# Patient Record
Sex: Female | Born: 1955 | Race: White | Hispanic: No | Marital: Single | Smoking: Never smoker
Health system: Southern US, Community
[De-identification: ages and names within clinical notes are randomized; demographics above are authoritative.]

---

## 1998-11-02 ENCOUNTER — Other Ambulatory Visit: Admission: RE | Admit: 1998-11-02 | Discharge: 1998-11-02 | Payer: Self-pay | Admitting: Obstetrics and Gynecology

## 1999-12-14 ENCOUNTER — Other Ambulatory Visit: Admission: RE | Admit: 1999-12-14 | Discharge: 1999-12-14 | Payer: Self-pay | Admitting: Obstetrics and Gynecology

## 2001-01-27 ENCOUNTER — Other Ambulatory Visit: Admission: RE | Admit: 2001-01-27 | Discharge: 2001-01-27 | Payer: Self-pay | Admitting: Obstetrics and Gynecology

## 2001-02-25 ENCOUNTER — Encounter (INDEPENDENT_AMBULATORY_CARE_PROVIDER_SITE_OTHER): Payer: Self-pay | Admitting: Specialist

## 2001-02-25 ENCOUNTER — Other Ambulatory Visit: Admission: RE | Admit: 2001-02-25 | Discharge: 2001-02-25 | Payer: Self-pay | Admitting: Obstetrics and Gynecology

## 2003-04-01 ENCOUNTER — Other Ambulatory Visit: Admission: RE | Admit: 2003-04-01 | Discharge: 2003-04-01 | Payer: Self-pay | Admitting: Family Medicine

## 2003-05-06 ENCOUNTER — Other Ambulatory Visit: Admission: RE | Admit: 2003-05-06 | Discharge: 2003-05-06 | Payer: Self-pay | Admitting: Obstetrics and Gynecology

## 2004-05-02 ENCOUNTER — Other Ambulatory Visit: Admission: RE | Admit: 2004-05-02 | Discharge: 2004-05-02 | Payer: Self-pay | Admitting: Obstetrics and Gynecology

## 2006-10-04 ENCOUNTER — Ambulatory Visit (HOSPITAL_COMMUNITY)
Admission: RE | Admit: 2006-10-04 | Discharge: 2006-10-04 | Payer: Self-pay | Admitting: Orthodontics and Dentofacial Orthopedics

## 2007-08-14 ENCOUNTER — Other Ambulatory Visit: Admission: RE | Admit: 2007-08-14 | Discharge: 2007-08-14 | Payer: Self-pay | Admitting: Obstetrics and Gynecology

## 2008-08-19 ENCOUNTER — Other Ambulatory Visit: Admission: RE | Admit: 2008-08-19 | Discharge: 2008-08-19 | Payer: Self-pay | Admitting: Obstetrics and Gynecology

## 2009-03-14 ENCOUNTER — Encounter: Admission: RE | Admit: 2009-03-14 | Discharge: 2009-03-14 | Payer: Self-pay | Admitting: Obstetrics & Gynecology

## 2009-05-05 ENCOUNTER — Encounter: Admission: RE | Admit: 2009-05-05 | Discharge: 2009-05-05 | Payer: Self-pay | Admitting: Obstetrics & Gynecology

## 2014-01-11 ENCOUNTER — Other Ambulatory Visit: Payer: Self-pay | Admitting: Internal Medicine

## 2014-01-11 DIAGNOSIS — Z1231 Encounter for screening mammogram for malignant neoplasm of breast: Secondary | ICD-10-CM

## 2014-02-22 ENCOUNTER — Ambulatory Visit
Admission: RE | Admit: 2014-02-22 | Discharge: 2014-02-22 | Disposition: A | Discharge status: Home / Self Care | Payer: BC Managed Care – PPO | Source: Ambulatory Visit | Attending: Internal Medicine | Admitting: Internal Medicine

## 2014-02-22 DIAGNOSIS — Z1231 Encounter for screening mammogram for malignant neoplasm of breast: Secondary | ICD-10-CM

## 2015-06-01 ENCOUNTER — Emergency Department (HOSPITAL_COMMUNITY)
Admission: EM | Admit: 2015-06-01 | Discharge: 2015-06-02 | Disposition: A | Discharge status: Home / Self Care | Payer: BC Managed Care – PPO | Attending: Emergency Medicine | Admitting: Emergency Medicine

## 2015-06-01 ENCOUNTER — Encounter (HOSPITAL_COMMUNITY): Payer: Self-pay | Admitting: Emergency Medicine

## 2015-06-01 DIAGNOSIS — Y9389 Activity, other specified: Secondary | ICD-10-CM | POA: Diagnosis not present

## 2015-06-01 DIAGNOSIS — Y998 Other external cause status: Secondary | ICD-10-CM | POA: Diagnosis not present

## 2015-06-01 DIAGNOSIS — IMO0002 Reserved for concepts with insufficient information to code with codable children: Secondary | ICD-10-CM

## 2015-06-01 DIAGNOSIS — Y9289 Other specified places as the place of occurrence of the external cause: Secondary | ICD-10-CM | POA: Insufficient documentation

## 2015-06-01 DIAGNOSIS — R52 Pain, unspecified: Secondary | ICD-10-CM

## 2015-06-01 DIAGNOSIS — W260XXA Contact with knife, initial encounter: Secondary | ICD-10-CM | POA: Insufficient documentation

## 2015-06-01 DIAGNOSIS — Z23 Encounter for immunization: Secondary | ICD-10-CM | POA: Insufficient documentation

## 2015-06-01 DIAGNOSIS — S61210A Laceration without foreign body of right index finger without damage to nail, initial encounter: Secondary | ICD-10-CM | POA: Diagnosis not present

## 2015-06-01 NOTE — ED Provider Notes (Signed)
CSN: [id number]     Arrival date & time 06/01/15  2227 History   This chart was scribed for non-physician practitioner working with April Palumbo, MD by Arlan OrganAshley Leger, ED Scribe. This patient was seen in room TR10C/TR10C and the patient's care was started at 12:00 AM.   Chief Complaint  Patient presents with  . Extremity Laceration   The history is provided by the patient and medical records. No language interpreter was used.    HPI Comments: Mikayla Collins is a 59 y.o. female without any pertinent past medical history who presents to the Emergency Department here for a laceration to the R 1st digit sustained at 9:30 PM this evening. Pt states she was cutting some fruit when she sliced her finger with a sharp knife. Bleeding controlled at this time. Area closed with steri strips and super glue. No recent fever or chills. No weakness, loss of sensation, or paresthesia. Pt unaware of Tetanus status. Pt with known allergy to Sulfa antibiotics.  History reviewed. No pertinent past medical history. History reviewed. No pertinent past surgical history. History reviewed. No pertinent family history. History  Substance Use Topics  . Smoking status: Never Smoker   . Smokeless tobacco: Not on file  . Alcohol Use: Yes   OB History    No data available     Review of Systems  Constitutional: Negative for fever and chills.  Gastrointestinal: Negative for nausea and vomiting.  Musculoskeletal: Positive for arthralgias.  Skin: Positive for wound.  Allergic/Immunologic: Negative for immunocompromised state.  Neurological: Negative for weakness and numbness.  Hematological: Does not bruise/bleed easily.  Psychiatric/Behavioral: The patient is not nervous/anxious.       Allergies  Sulfa antibiotics  Home Medications   Prior to Admission medications   Not on File   Triage Vitals: BP 137/94 mmHg  Pulse 71  Temp(Src) 97.9 F (36.6 C) (Oral)  Resp 18  SpO2 97%   Physical Exam   Constitutional: She is oriented to person, place, and time. She appears well-developed and well-nourished. No distress.  HENT:  Head: Normocephalic and atraumatic.  Eyes: Conjunctivae are normal. No scleral icterus.  Neck: Normal range of motion.  Cardiovascular: Normal rate, regular rhythm, normal heart sounds and intact distal pulses.   No murmur heard. Capillary refill < 3 sec  Pulmonary/Chest: Effort normal and breath sounds normal. No respiratory distress.  Musculoskeletal: Normal range of motion. She exhibits no edema.  ROM: Full range of motion of all joints of the right pointer finger  Neurological: She is alert and oriented to person, place, and time.  Sensation: Intact to dull and sharp Strength: 5/5 with flexion and extension of the PIP, DIP and MCP of the right pointer finger, strong and equal grip strength  Skin: Skin is warm and dry. She is not diaphoretic.  2.7 cm laceration to the right pointer finger  Psychiatric: She has a normal mood and affect.  Nursing note and vitals reviewed.   ED Course  Procedures (including critical care time)  DIAGNOSTIC STUDIES: Oxygen Saturation is 97% on RA, adequate by my interpretation.    COORDINATION OF CARE: 12:00 AM- Will perform laceration repair. Discussed treatment plan with pt at bedside and pt agreed to plan.     LACERATION REPAIR Performed by: Dierdre ForthHannah Leona Pressly PA-C  Consent: Verbal consent obtained. Risks and benefits: risks, benefits and alternatives were discussed Patient identity confirmed: provided demographic data Time out performed prior to procedure Prepped and Draped in normal sterile fashion Wound explored Laceration  Location: Lateral side of the distal Right pointer finger Laceration Length: 2.7 cm No Foreign Bodies seen or palpated Anesthesia: local infiltration Local anesthetic: lidocaine 1 % without epinephrine Anesthetic total: 5 ml Irrigation method: syringe Amount of cleaning: standard Skin  closure: 5-0 proline  Number of sutures or staples: 3  Technique: Simple interrupted  Patient tolerance: Patient tolerated the procedure well with no immediate complications.   Labs Review Labs Reviewed - No data to display  Imaging Review No results found.   EKG Interpretation None      MDM   Final diagnoses:  Laceration of right index finger w/o foreign body w/o damage to nail, initial encounter   Mikayla Collins presents with laceration to the right index finger.  Pressure irrigation performed. Wound explored and base of wound visualized in a bloodless field without evidence of foreign body.  Laceration occurred < 8 hours prior to repair which was well tolerated. Tdap updated.  Pt has no comorbidities to effect normal wound healing. Pt discharged without antibiotics.  Discussed suture home care with patient and answered questions. Pt to follow-up for wound check and suture removal in 7 days; they are to return to the ED sooner for signs of infection. Pt is hemodynamically stable with no complaints prior to dc.   BP 137/94 mmHg  Pulse 71  Temp(Src) 97.9 F (36.6 C) (Oral)  Resp 18  SpO2 97%  I personally performed the services described in this documentation, which was scribed in my presence. The recorded information has been reviewed and is accurate.    Dierdre Forth, PA-C 06/02/15 0120  April Palumbo, MD 06/02/15 0139

## 2015-06-01 NOTE — ED Notes (Signed)
Laceration to right 1st finger from kitchen knife. Patient closed wound with steri strips and super glue. Currently not bleeding. Patient complaining of significant pain.

## 2015-06-02 MED ORDER — ONDANSETRON 4 MG PO TBDP
8.0000 mg | ORAL_TABLET | Freq: Once | ORAL | Status: AC
  Administered 2015-06-02: 8 mg via ORAL
  Filled 2015-06-02: qty 2

## 2015-06-02 MED ORDER — LIDOCAINE HCL (PF) 1 % IJ SOLN
5.0000 mL | Freq: Once | INTRAMUSCULAR | Status: AC
  Administered 2015-06-02: 5 mL via INTRADERMAL
  Filled 2015-06-02: qty 5

## 2015-06-02 MED ORDER — TETANUS-DIPHTH-ACELL PERTUSSIS 5-2.5-18.5 LF-MCG/0.5 IM SUSP
0.5000 mL | Freq: Once | INTRAMUSCULAR | Status: AC
  Administered 2015-06-02: 0.5 mL via INTRAMUSCULAR
  Filled 2015-06-02: qty 0.5

## 2015-06-02 NOTE — ED Notes (Signed)
Superglue removed from finger.

## 2015-06-02 NOTE — Discharge Instructions (Signed)

## 2015-06-02 NOTE — ED Notes (Signed)
Pt refusing xray. Provider aware.

## 2015-06-02 NOTE — ED Notes (Signed)
Pt c/o nausea and dizziness. Assisted to bathroom by RN

## 2016-05-11 ENCOUNTER — Ambulatory Visit
Admission: RE | Admit: 2016-05-11 | Discharge: 2016-05-11 | Disposition: A | Discharge status: Home / Self Care | Payer: BC Managed Care – PPO | Source: Ambulatory Visit | Attending: Rheumatology | Admitting: Rheumatology

## 2016-05-11 ENCOUNTER — Other Ambulatory Visit: Payer: Self-pay | Admitting: Rheumatology

## 2016-05-11 DIAGNOSIS — Z9225 Personal history of immunosupression therapy: Secondary | ICD-10-CM

## 2016-11-08 ENCOUNTER — Other Ambulatory Visit: Payer: Self-pay | Admitting: Internal Medicine

## 2016-11-08 DIAGNOSIS — E2839 Other primary ovarian failure: Secondary | ICD-10-CM

## 2016-11-16 ENCOUNTER — Ambulatory Visit
Admission: RE | Admit: 2016-11-16 | Discharge: 2016-11-16 | Disposition: A | Discharge status: Home / Self Care | Payer: BC Managed Care – PPO | Source: Ambulatory Visit | Attending: Internal Medicine | Admitting: Internal Medicine

## 2016-11-16 DIAGNOSIS — E2839 Other primary ovarian failure: Secondary | ICD-10-CM

## 2017-01-28 ENCOUNTER — Ambulatory Visit
Admission: RE | Admit: 2017-01-28 | Discharge: 2017-01-28 | Disposition: A | Discharge status: Home / Self Care | Payer: BC Managed Care – PPO | Source: Ambulatory Visit | Attending: Chiropractic Medicine | Admitting: Chiropractic Medicine

## 2017-01-28 ENCOUNTER — Other Ambulatory Visit: Payer: Self-pay | Admitting: Chiropractic Medicine

## 2017-01-28 DIAGNOSIS — M25111 Fistula, right shoulder: Secondary | ICD-10-CM

## 2017-01-28 DIAGNOSIS — M531 Cervicobrachial syndrome: Secondary | ICD-10-CM

## 2017-01-28 DIAGNOSIS — G8929 Other chronic pain: Secondary | ICD-10-CM

## 2017-01-28 DIAGNOSIS — M25511 Pain in right shoulder: Secondary | ICD-10-CM

## 2019-04-28 ENCOUNTER — Other Ambulatory Visit: Payer: Self-pay | Admitting: Obstetrics and Gynecology

## 2019-10-21 ENCOUNTER — Telehealth: Payer: BC Managed Care – PPO | Admitting: Family

## 2019-10-21 DIAGNOSIS — Z20828 Contact with and (suspected) exposure to other viral communicable diseases: Secondary | ICD-10-CM

## 2019-10-21 DIAGNOSIS — Z20822 Contact with and (suspected) exposure to covid-19: Secondary | ICD-10-CM

## 2019-10-21 MED ORDER — BENZONATATE 100 MG PO CAPS: 100.0000 mg | ORAL_CAPSULE | Freq: Three times a day (TID) | ORAL | 0 refills | Status: AC | PRN

## 2019-10-21 NOTE — Progress Notes (Signed)
E-Visit for Corona Virus Screening   Your current symptoms could be consistent with the coronavirus.  Many health care providers can now test patients at their office but not all are.  [facility] has multiple testing sites. For information on our COVID testing locations and hours go to [email or web address]  Please quarantine yourself while awaiting your test results.  We are enrolling you in our MyChart Home Montioring for COVID19 . Daily you will receive a questionnaire within the MyChart website. Our COVID 19 response team willl be monitoriing your responses daily. Please continue good preventive care measures, including:  frequent hand-washing, avoid touching your face, cover coughs/sneezes, stay out of crowds and keep a 6 foot distance from others.   You can go to one of the testing sites listed below, while they are opened (see hours). You do not need an order and will stay in your car during the test. You do need to self isolate until your results return and if positive 14 days from when your symptoms started and until you are 3 days fever free.   Testing Locations (Monday - Friday, 10 a.m. - 3 p.m.) . [place]: [facility], [address], , [place]  . [place]: [facility], [address], [place], [place] (entrance off [address])  . [place]: (Closed each Monday): Testing site relocated to the short stay covered drive at [facility]. (Use the [address] entrance to [facility] next to [facility].)    COVID-19 is a respiratory illness with symptoms that are similar to the flu. Symptoms are typically mild to moderate, but there have been cases of severe illness and death due to the virus. The following symptoms may appear 2-14 days after exposure: . Fever . Cough . Shortness of breath or difficulty breathing . Chills . Repeated shaking with chills . Muscle  pain . Headache . Sore throat . New loss of taste or smell . Fatigue . Congestion or runny nose . Nausea or vomiting . Diarrhea  If you develop fever/cough/breathlessness, please stay home for 10 days with improving symptoms and until you have had 24 hours of no fever (without taking a fever reducer).  Go to the nearest hospital ED for assessment if fever/cough/breathlessness are severe or illness seems like a threat to life.  It is vitally important that if you feel that you have an infection such as this virus or any other virus that you stay home and away from places where you may spread it to others.  You should avoid contact with people age 65 and older.   You should wear a mask or cloth face covering over your nose and mouth if you must be around other people or animals, including pets (even at home). Try to stay at least 6 feet away from other people. This will protect the people around you.  You can use medication such as A prescription cough medication called Tessalon Perles 100 mg. You may take 1-2 capsules every 8 hours as needed for cough.  You may also take acetaminophen (Tylenol) as needed for fever.   Reduce your risk of any infection by using the same precautions used for avoiding the common cold or flu:  . Wash your hands often with soap and warm water for at least 20 seconds.  If soap and water are not readily available, use an alcohol-based hand sanitizer with at least 60% alcohol.  . If coughing or   sneezing, cover your mouth and nose by coughing or sneezing into the elbow areas of your shirt or coat, into a tissue or into your sleeve (not your hands). . Avoid shaking hands with others and consider head nods or verbal greetings only. . Avoid touching your eyes, nose, or mouth with unwashed hands.  . Avoid close contact with people who are sick. . Avoid places or events with large numbers of people in one location, like concerts or sporting events. . Carefully consider  travel plans you have or are making. . If you are planning any travel outside or inside the [place], visit the CDC's Travelers' Health webpage for the latest health notices. . If you have some symptoms but not all symptoms, continue to monitor at home and seek medical attention if your symptoms worsen. . If you are having a medical emergency, call 911.  HOME CARE . Only take medications as instructed by your medical team. . Drink plenty of fluids and get plenty of rest. . A steam or ultrasonic humidifier can help if you have congestion.   GET HELP RIGHT AWAY IF YOU HAVE EMERGENCY WARNING SIGNS** FOR COVID-19. If you or someone is showing any of these signs seek emergency medical care immediately. Call 911 or proceed to your closest emergency facility if: . You develop worsening high fever. . Trouble breathing . Bluish lips or face . Persistent pain or pressure in the chest . New confusion . Inability to wake or stay awake . You cough up blood. . Your symptoms become more severe  **This list is not all possible symptoms. Contact your medical provider for any symptoms that are sever or concerning to you.   MAKE SURE YOU   Understand these instructions.  Will watch your condition.  Will get help right away if you are not doing well or get worse.  Your e-visit answers were reviewed by a board certified advanced clinical practitioner to complete your personal care plan.  Depending on the condition, your plan could have included both over the counter or prescription medications.  If there is a problem please reply once you have received a response from your provider.  Your safety is important to us.  If you have drug allergies check your prescription carefully.    You can use MyChart to ask questions about today's visit, request a non-urgent call back, or ask for a work or school excuse for 24 hours related to this e-Visit. If it has been greater than 24 hours you will need to follow up with  your provider, or enter a new e-Visit to address those concerns. You will get an e-mail in the next two days asking about your experience.  I hope that your e-visit has been valuable and will speed your recovery. Thank you for using e-visits.  Approximately 5 minutes was spent documenting and reviewing patient's chart.    

## 2019-10-26 ENCOUNTER — Encounter (INDEPENDENT_AMBULATORY_CARE_PROVIDER_SITE_OTHER): Payer: Self-pay

## 2019-12-20 ENCOUNTER — Ambulatory Visit: Payer: BC Managed Care – PPO

## 2019-12-26 ENCOUNTER — Ambulatory Visit: Payer: Medicare PPO | Attending: Internal Medicine

## 2019-12-26 DIAGNOSIS — Z23 Encounter for immunization: Secondary | ICD-10-CM

## 2019-12-26 NOTE — Progress Notes (Signed)
   Covid-19 Vaccination Clinic  Name:  Mulki Roesler    MRN: [id number] DOB: [birth year]  12/26/2019  Ms. Swinger was observed post Covid-19 immunization for 15 minutes without incidence. She was provided with Vaccine Information Sheet and instruction to access the V-Safe system.   Ms. Reeder was instructed to call 911 with any severe reactions post vaccine: Marland Kitchen Difficulty breathing  . Swelling of your face and throat  . A fast heartbeat  . A bad rash all over your body  . Dizziness and weakness    Immunizations Administered    Name Date Dose VIS Date Route   Pfizer COVID-19 Vaccine 12/26/2019 11:47 AM 0.3 mL 10/30/2019 Intramuscular   Manufacturer: [employer], [employer]   Lot: [id number]   NDC: [address]-0

## 2019-12-31 ENCOUNTER — Ambulatory Visit: Payer: BC Managed Care – PPO

## 2020-01-20 ENCOUNTER — Ambulatory Visit: Payer: Medicare PPO | Attending: Internal Medicine

## 2020-01-20 DIAGNOSIS — Z23 Encounter for immunization: Secondary | ICD-10-CM

## 2020-01-20 NOTE — Progress Notes (Signed)
   Covid-19 Vaccination Clinic  Name:  Mikayla Collins    MRN: [id number] DOB: 1956/05/30  01/20/2020  Mikayla Collins was observed post Covid-19 immunization for 15 minutes without incident. She was provided with Vaccine Information Sheet and instruction to access the V-Safe system.   Mikayla Collins was instructed to call 911 with any severe reactions post vaccine: Marland Kitchen Difficulty breathing  . Swelling of face and throat  . A fast heartbeat  . A bad rash all over body  . Dizziness and weakness   Immunizations Administered    Name Date Dose VIS Date Route   Pfizer COVID-19 Vaccine 01/20/2020  3:43 PM 0.3 mL 10/30/2019 Intramuscular   Manufacturer: [employer], [employer]   Lot: [id number]   NDC: [address]-0

## 2020-07-20 ENCOUNTER — Other Ambulatory Visit: Payer: Self-pay | Admitting: Internal Medicine

## 2020-07-20 ENCOUNTER — Other Ambulatory Visit: Payer: Self-pay

## 2020-07-20 ENCOUNTER — Ambulatory Visit
Admission: RE | Admit: 2020-07-20 | Discharge: 2020-07-20 | Disposition: A | Discharge status: Home / Self Care | Payer: Medicare PPO | Source: Ambulatory Visit | Attending: Internal Medicine | Admitting: Internal Medicine

## 2020-07-20 DIAGNOSIS — M79644 Pain in right finger(s): Secondary | ICD-10-CM

## 2020-07-28 ENCOUNTER — Other Ambulatory Visit: Payer: Self-pay | Admitting: Podiatry

## 2020-07-28 ENCOUNTER — Ambulatory Visit: Payer: Medicare PPO | Admitting: Podiatry

## 2020-07-28 ENCOUNTER — Other Ambulatory Visit: Payer: Self-pay

## 2020-07-28 ENCOUNTER — Ambulatory Visit (INDEPENDENT_AMBULATORY_CARE_PROVIDER_SITE_OTHER): Payer: Medicare PPO

## 2020-07-28 DIAGNOSIS — M79672 Pain in left foot: Secondary | ICD-10-CM

## 2020-07-28 DIAGNOSIS — S93602A Unspecified sprain of left foot, initial encounter: Secondary | ICD-10-CM

## 2020-07-28 NOTE — Progress Notes (Signed)
  Subjective:  Patient ID: Mikayla Collins, female    DOB: August 02, 1956,  MRN: [id number]  Chief Complaint  Patient presents with  . Foot Pain    pt is here for left foot pain, pain is primarily at the left dorsal foot, pain has been going on for about 4 weeks    64 y.o. female presents with the above complaint. History confirmed with patient.  This happened when she rolled her foot off the side of a curb.  She is tried wrapping with an Ace wrap but that has only helped.  She has a very tight and swollen sensation.  And pain over the metatarsal area on the left side.  She is tried icing compression left foot help either.  She did have some bruising that has improved.  No pain in the ankle.  Has been able to bear weight with difficulty. Objective:  Physical Exam: warm, good capillary refill, no trophic changes or ulcerative lesions, normal DP and PT pulses and normal sensory exam. Left Foot: Pain upon palpation to the dorsal metatarsal area, most significantly along the fourth metatarsal, this is worse with vibrations when turning fork  Radiographs: X-ray of left foot: No clear evidence of fracture or stress fracture Assessment:   1. Foot pain, left   2. Sprain of left foot, initial encounter      Plan:  Patient was evaluated and treated and all questions answered.  -Hopefully she just has a mild sprain of the forefoot.  I recommend we place her in a CAM boot for immobilization and support and continue RICE protocol.  She did not have clear radiographic evidence of a stress fracture today although she does have some clinical signs.  I recommended she return in 3 weeks and if she is not better at that time we will consider an MRI of the foot.   Return in about 3 weeks (around 08/18/2020) for re-check L foot sprain.

## 2020-08-19 ENCOUNTER — Other Ambulatory Visit: Payer: Self-pay

## 2020-08-19 ENCOUNTER — Ambulatory Visit (INDEPENDENT_AMBULATORY_CARE_PROVIDER_SITE_OTHER): Payer: Medicare PPO | Admitting: Podiatry

## 2020-08-19 DIAGNOSIS — M79672 Pain in left foot: Secondary | ICD-10-CM

## 2020-08-19 DIAGNOSIS — S93602D Unspecified sprain of left foot, subsequent encounter: Secondary | ICD-10-CM

## 2020-08-20 ENCOUNTER — Encounter: Payer: Self-pay | Admitting: Podiatry

## 2020-08-20 NOTE — Progress Notes (Signed)
  Subjective:  Patient ID: Mikayla Collins, female    DOB: 20-Jun-1956,  MRN: [id number]  Chief Complaint  Patient presents with  . Foot Pain    pt is here for left foot pain, pain is primarily at the left dorsal foot, pain has been going on for about 4 weeks    64 y.o. female returns with the above complaint. History confirmed with patient.  She discontinued use of the CAM boot last Tuesday.  She is doing quite well and has no pain.  She would like to resume exercise. Objective:  Physical Exam: warm, good capillary refill, no trophic changes or ulcerative lesions, normal DP and PT pulses and normal sensory exam. Left Foot: No pain today on exam with manipulation or palpation of the previously painful areas in the forefoot and midtarsal joint    Assessment:   1. Foot pain, left   2. Sprain of left foot, initial encounter      Plan:  Patient was evaluated and treated and all questions answered.  -Pain has resolved.  Comment this was likely just a mild sprain.  No indication for repeat x-rays or further imaging with MRI today.  I recommended she resume her regular activities and normal shoe gear.  We discussed the 30-minute rule and she will use this to transition to increase activity.  Return or call with questions or if new issues arise or if worsens

## 2020-08-25 ENCOUNTER — Ambulatory Visit: Payer: Medicare PPO | Admitting: Podiatry

## 2021-09-27 ENCOUNTER — Other Ambulatory Visit: Payer: Self-pay | Admitting: Obstetrics and Gynecology

## 2021-09-27 DIAGNOSIS — M8580 Other specified disorders of bone density and structure, unspecified site: Secondary | ICD-10-CM

## 2021-10-05 ENCOUNTER — Ambulatory Visit
Admission: RE | Admit: 2021-10-05 | Discharge: 2021-10-05 | Disposition: A | Discharge status: Home / Self Care | Payer: Medicare PPO | Source: Ambulatory Visit | Attending: Obstetrics and Gynecology | Admitting: Obstetrics and Gynecology

## 2021-10-05 ENCOUNTER — Other Ambulatory Visit: Payer: Self-pay

## 2021-10-05 DIAGNOSIS — M8580 Other specified disorders of bone density and structure, unspecified site: Secondary | ICD-10-CM

## 2022-01-12 ENCOUNTER — Other Ambulatory Visit: Payer: Self-pay

## 2022-01-12 DIAGNOSIS — E7800 Pure hypercholesterolemia, unspecified: Secondary | ICD-10-CM

## 2022-03-01 ENCOUNTER — Other Ambulatory Visit: Payer: Medicare PPO

## 2022-04-05 ENCOUNTER — Other Ambulatory Visit: Payer: Medicare PPO

## 2022-04-08 DIAGNOSIS — R0981 Nasal congestion: Secondary | ICD-10-CM | POA: Diagnosis not present

## 2022-04-08 DIAGNOSIS — Z20822 Contact with and (suspected) exposure to covid-19: Secondary | ICD-10-CM | POA: Diagnosis not present

## 2022-04-08 DIAGNOSIS — R051 Acute cough: Secondary | ICD-10-CM | POA: Diagnosis not present

## 2022-04-08 DIAGNOSIS — M791 Myalgia, unspecified site: Secondary | ICD-10-CM | POA: Diagnosis not present

## 2022-04-12 ENCOUNTER — Other Ambulatory Visit: Payer: Medicare PPO

## 2022-05-10 ENCOUNTER — Ambulatory Visit
Admission: RE | Admit: 2022-05-10 | Discharge: 2022-05-10 | Disposition: A | Discharge status: Home / Self Care | Payer: No Typology Code available for payment source | Source: Ambulatory Visit

## 2022-05-10 DIAGNOSIS — E7800 Pure hypercholesterolemia, unspecified: Secondary | ICD-10-CM

## 2022-07-05 DIAGNOSIS — Z0000 Encounter for general adult medical examination without abnormal findings: Secondary | ICD-10-CM | POA: Diagnosis not present

## 2022-07-05 DIAGNOSIS — R937 Abnormal findings on diagnostic imaging of other parts of musculoskeletal system: Secondary | ICD-10-CM | POA: Diagnosis not present

## 2022-07-05 DIAGNOSIS — E7800 Pure hypercholesterolemia, unspecified: Secondary | ICD-10-CM | POA: Diagnosis not present

## 2022-07-05 DIAGNOSIS — Z79899 Other long term (current) drug therapy: Secondary | ICD-10-CM | POA: Diagnosis not present

## 2022-07-09 DIAGNOSIS — E7800 Pure hypercholesterolemia, unspecified: Secondary | ICD-10-CM | POA: Diagnosis not present

## 2022-07-09 DIAGNOSIS — R490 Dysphonia: Secondary | ICD-10-CM | POA: Diagnosis not present

## 2022-07-09 DIAGNOSIS — J302 Other seasonal allergic rhinitis: Secondary | ICD-10-CM | POA: Diagnosis not present

## 2022-07-09 DIAGNOSIS — K644 Residual hemorrhoidal skin tags: Secondary | ICD-10-CM | POA: Diagnosis not present

## 2022-07-09 DIAGNOSIS — R058 Other specified cough: Secondary | ICD-10-CM | POA: Diagnosis not present

## 2022-07-09 DIAGNOSIS — Z1211 Encounter for screening for malignant neoplasm of colon: Secondary | ICD-10-CM | POA: Diagnosis not present

## 2022-07-09 DIAGNOSIS — R911 Solitary pulmonary nodule: Secondary | ICD-10-CM | POA: Diagnosis not present

## 2022-07-09 DIAGNOSIS — F3160 Bipolar disorder, current episode mixed, unspecified: Secondary | ICD-10-CM | POA: Diagnosis not present

## 2022-08-22 DIAGNOSIS — Z1211 Encounter for screening for malignant neoplasm of colon: Secondary | ICD-10-CM | POA: Diagnosis not present

## 2022-08-22 DIAGNOSIS — K649 Unspecified hemorrhoids: Secondary | ICD-10-CM | POA: Diagnosis not present

## 2022-08-29 DIAGNOSIS — Z1211 Encounter for screening for malignant neoplasm of colon: Secondary | ICD-10-CM | POA: Diagnosis not present

## 2022-08-29 DIAGNOSIS — Z1212 Encounter for screening for malignant neoplasm of rectum: Secondary | ICD-10-CM | POA: Diagnosis not present

## 2022-09-05 LAB — EXTERNAL GENERIC LAB PROCEDURE: COLOGUARD: NEGATIVE

## 2022-09-20 ENCOUNTER — Other Ambulatory Visit: Payer: Self-pay

## 2022-09-20 DIAGNOSIS — R911 Solitary pulmonary nodule: Secondary | ICD-10-CM

## 2022-10-09 IMAGING — CT CT CARDIAC CORONARY ARTERY CALCIUM SCORE
1 of 3 series · 5 of 20 positions shown, 7 images · non-contrast
Comparison: None Available.

EXAM:
CT CARDIAC CORONARY ARTERY CALCIUM SCORE
TECHNIQUE: Non-contrast imaging through the heart was performed using
prospective ECG gating. Image post processing was performed on an
independent workstation, allowing for quantitative analysis of the
heart and coronary arteries. Note that this exam targets the heart
and the chest was not imaged in its entirety.

[Series 3: calcium scoring 2.00 br40 bestdiast 69% axial · axial · 0.31mm/px · z∈[+1424,+1528]mm · 5 of 80 slices shown, 7 images]
[im 14/80  vessel]
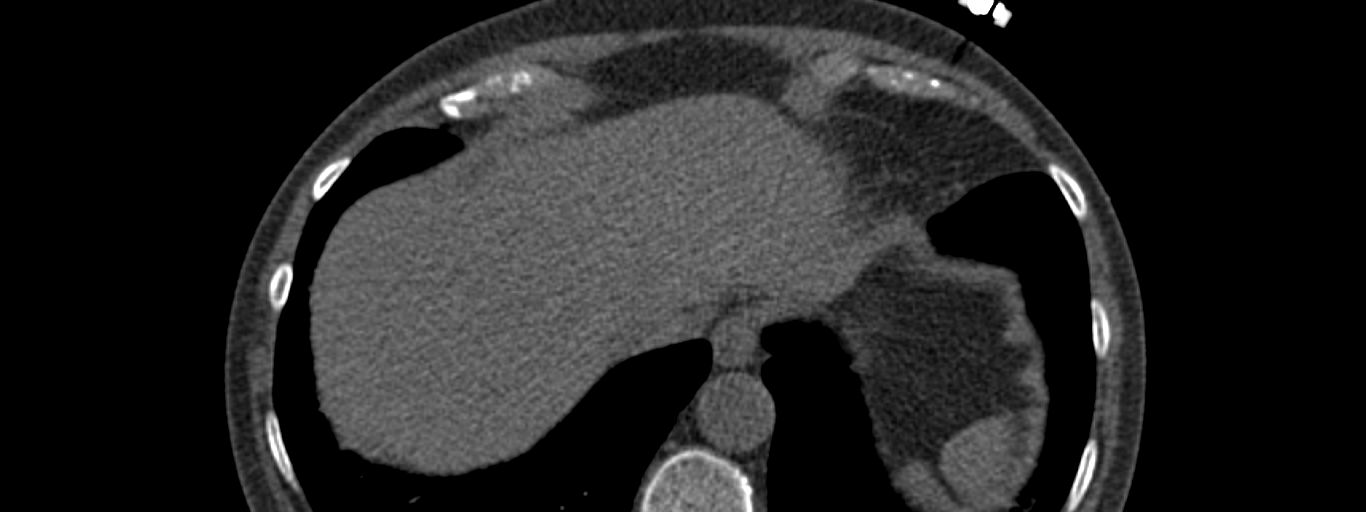
[im 14/80  lung]
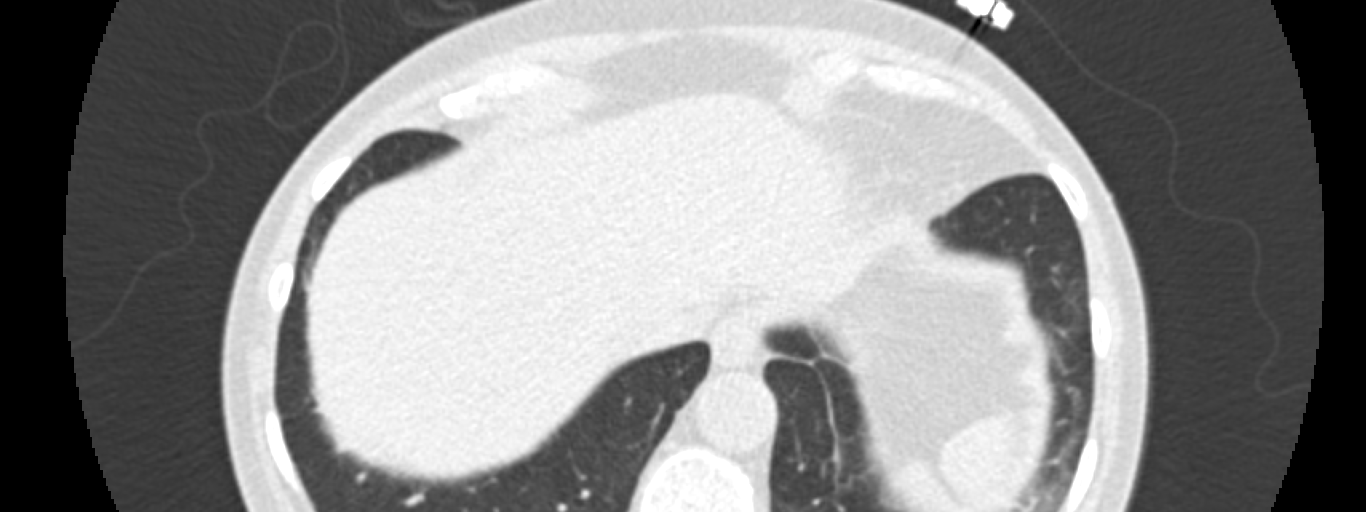
[im 27/80  vessel]
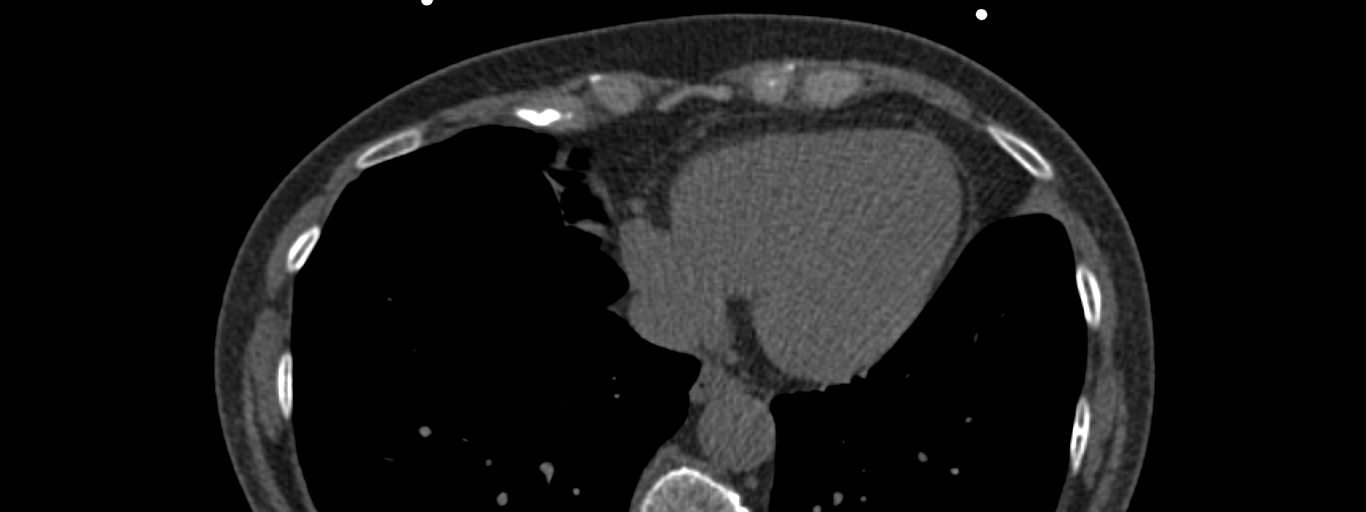
[im 40/80  vessel]
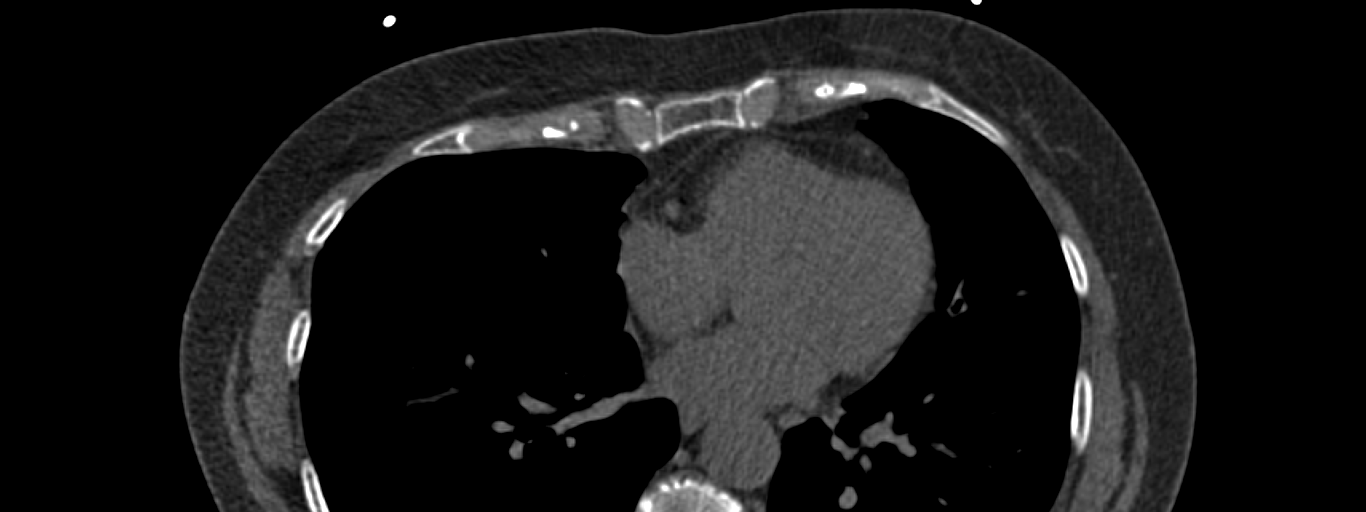
[im 53/80  vessel]
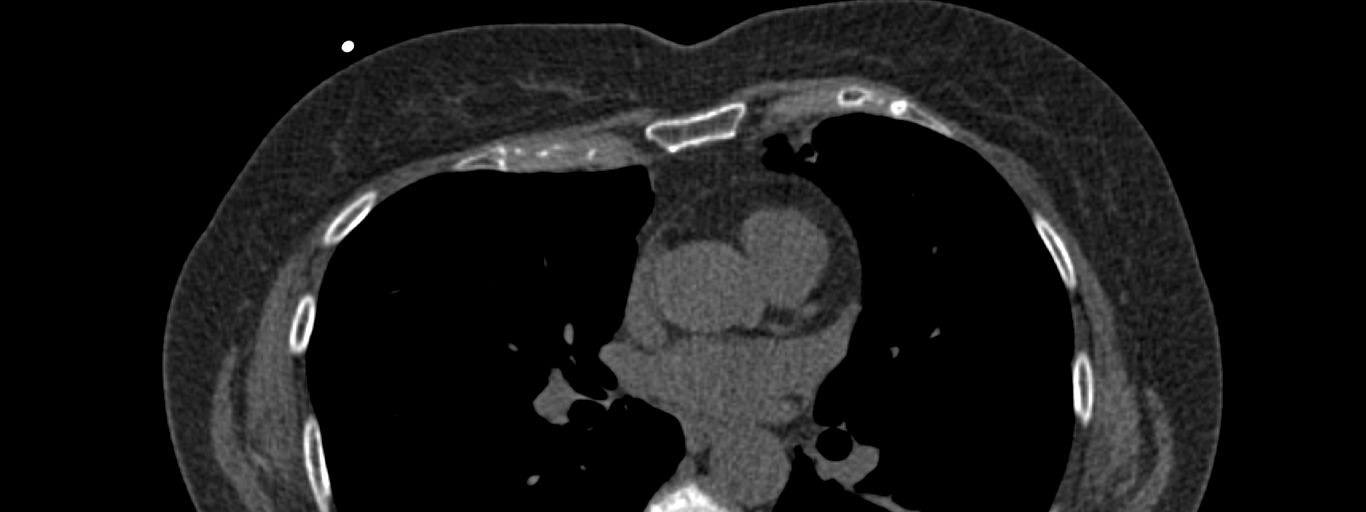
[im 66/80  vessel]
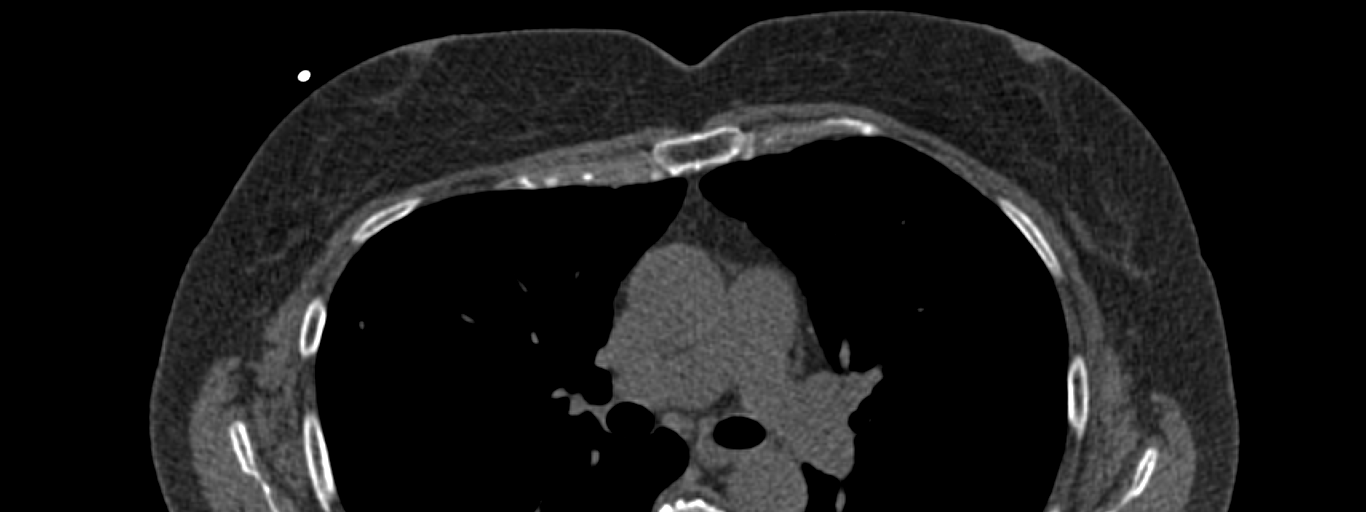
[im 66/80  lung]
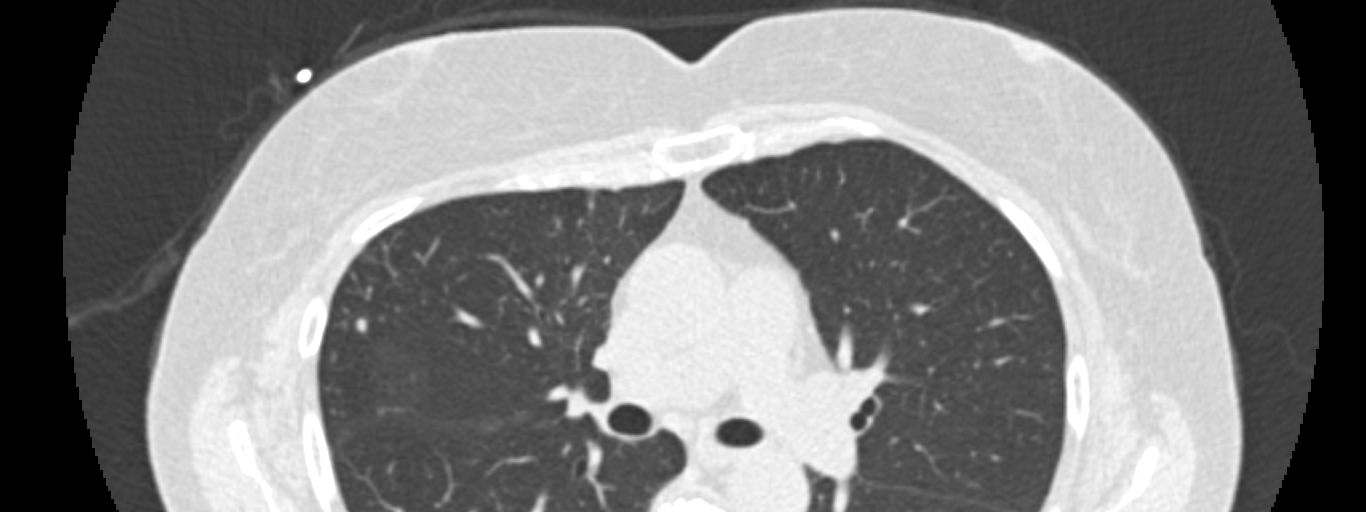

[5 of 20 positions shown; findings below may reference images not displayed]

FINDINGS: CORONARY CALCIUM SCORES:

Left Main: 0

LAD: 0

LCx: 0

RCA: 0

Total Agatston Score: 0

[HOSPITAL] percentile: 0

AORTA MEASUREMENTS:

Ascending Aorta: 31 mm

Descending Aorta: 25 mm

OTHER FINDINGS:

Vascular: Normal heart size. No pericardial effusion. Normal caliber
thoracic aorta with no evidence of atherosclerotic disease.

Mediastinum/Nodes: Esophagus is unremarkable. No pathologically
enlarged lymph nodes seen in the chest.

Lungs/Pleura: Central airways are patent. No consolidation, pleural
effusion or pneumothorax. Clustered calcified nodules seen in the
right upper lobe on series 9, image 9, likely sequela of prior
granulomatous infection. Additional bilateral noncalcified solid
pulmonary nodules are seen. Largest is a nodule in the lingula
measuring 8 mm in mean diameter on series 9, image 26. Focal
bronchiectasis and linear opacities of the lingula, likely sequela
of prior infection.

Upper Abdomen: No acute abnormality.

Musculoskeletal: No chest wall mass or suspicious bone lesions
identified.
IMPRESSION: 1. Total calcium score of 0 is at percentile 0 for subjects of the
same age, gender, and race/ethnicity.
2. Bilateral solid pulmonary nodules, largest is a irregular nodule
in the lingula measuring 8 mm. Non-contrast chest CT at 6-12 months
is recommended. If the nodule is stable at time of repeat CT, then
future CT at 18-24 months (from today's scan) is considered optional
for low-risk patients, but is recommended for high-risk patients.
This recommendation follows the consensus statement: Guidelines for
Management of Incidental Pulmonary Nodules Detected on CT Images:

## 2022-11-14 DIAGNOSIS — M1990 Unspecified osteoarthritis, unspecified site: Secondary | ICD-10-CM | POA: Diagnosis not present

## 2022-11-14 DIAGNOSIS — M2550 Pain in unspecified joint: Secondary | ICD-10-CM | POA: Diagnosis not present

## 2022-11-14 DIAGNOSIS — Z889 Allergy status to unspecified drugs, medicaments and biological substances status: Secondary | ICD-10-CM | POA: Diagnosis not present

## 2022-11-14 DIAGNOSIS — R911 Solitary pulmonary nodule: Secondary | ICD-10-CM | POA: Diagnosis not present

## 2022-11-14 DIAGNOSIS — F988 Other specified behavioral and emotional disorders with onset usually occurring in childhood and adolescence: Secondary | ICD-10-CM | POA: Diagnosis not present

## 2022-11-23 DIAGNOSIS — Z20822 Contact with and (suspected) exposure to covid-19: Secondary | ICD-10-CM | POA: Diagnosis not present

## 2022-11-23 DIAGNOSIS — B349 Viral infection, unspecified: Secondary | ICD-10-CM | POA: Diagnosis not present

## 2022-11-23 DIAGNOSIS — J40 Bronchitis, not specified as acute or chronic: Secondary | ICD-10-CM | POA: Diagnosis not present

## 2022-11-27 DIAGNOSIS — J479 Bronchiectasis, uncomplicated: Secondary | ICD-10-CM | POA: Diagnosis not present

## 2022-11-27 DIAGNOSIS — R911 Solitary pulmonary nodule: Secondary | ICD-10-CM | POA: Diagnosis not present

## 2022-11-28 DIAGNOSIS — R918 Other nonspecific abnormal finding of lung field: Secondary | ICD-10-CM | POA: Diagnosis not present

## 2022-11-28 DIAGNOSIS — J22 Unspecified acute lower respiratory infection: Secondary | ICD-10-CM | POA: Diagnosis not present

## 2022-11-28 DIAGNOSIS — J479 Bronchiectasis, uncomplicated: Secondary | ICD-10-CM | POA: Diagnosis not present

## 2022-11-28 DIAGNOSIS — R911 Solitary pulmonary nodule: Secondary | ICD-10-CM | POA: Diagnosis not present

## 2022-11-28 DIAGNOSIS — Z111 Encounter for screening for respiratory tuberculosis: Secondary | ICD-10-CM | POA: Diagnosis not present

## 2022-12-24 DIAGNOSIS — J209 Acute bronchitis, unspecified: Secondary | ICD-10-CM | POA: Diagnosis not present

## 2022-12-24 DIAGNOSIS — R911 Solitary pulmonary nodule: Secondary | ICD-10-CM | POA: Diagnosis not present

## 2022-12-26 DIAGNOSIS — R911 Solitary pulmonary nodule: Secondary | ICD-10-CM | POA: Diagnosis not present

## 2022-12-31 DIAGNOSIS — R911 Solitary pulmonary nodule: Secondary | ICD-10-CM | POA: Diagnosis not present

## 2023-01-02 DIAGNOSIS — R911 Solitary pulmonary nodule: Secondary | ICD-10-CM | POA: Diagnosis not present

## 2023-01-02 DIAGNOSIS — R053 Chronic cough: Secondary | ICD-10-CM | POA: Diagnosis not present

## 2023-01-10 DIAGNOSIS — R911 Solitary pulmonary nodule: Secondary | ICD-10-CM | POA: Diagnosis not present

## 2023-01-10 DIAGNOSIS — R059 Cough, unspecified: Secondary | ICD-10-CM | POA: Diagnosis not present

## 2023-01-15 DIAGNOSIS — M2550 Pain in unspecified joint: Secondary | ICD-10-CM | POA: Diagnosis not present

## 2023-01-15 DIAGNOSIS — M62838 Other muscle spasm: Secondary | ICD-10-CM | POA: Diagnosis not present

## 2023-04-16 DIAGNOSIS — F909 Attention-deficit hyperactivity disorder, unspecified type: Secondary | ICD-10-CM | POA: Diagnosis not present

## 2023-04-16 DIAGNOSIS — M069 Rheumatoid arthritis, unspecified: Secondary | ICD-10-CM | POA: Diagnosis not present

## 2023-05-19 DIAGNOSIS — R062 Wheezing: Secondary | ICD-10-CM | POA: Diagnosis not present

## 2023-05-19 DIAGNOSIS — R059 Cough, unspecified: Secondary | ICD-10-CM | POA: Diagnosis not present

## 2023-05-19 DIAGNOSIS — R0989 Other specified symptoms and signs involving the circulatory and respiratory systems: Secondary | ICD-10-CM | POA: Diagnosis not present

## 2023-06-19 DIAGNOSIS — U071 COVID-19: Secondary | ICD-10-CM | POA: Diagnosis not present

## 2023-06-19 DIAGNOSIS — J209 Acute bronchitis, unspecified: Secondary | ICD-10-CM | POA: Diagnosis not present

## 2023-07-01 DIAGNOSIS — J0110 Acute frontal sinusitis, unspecified: Secondary | ICD-10-CM | POA: Diagnosis not present

## 2023-08-23 DIAGNOSIS — F909 Attention-deficit hyperactivity disorder, unspecified type: Secondary | ICD-10-CM | POA: Diagnosis not present

## 2023-08-23 DIAGNOSIS — Z889 Allergy status to unspecified drugs, medicaments and biological substances status: Secondary | ICD-10-CM | POA: Diagnosis not present
